# Patient Record
Sex: Male | Born: 1965 | Hispanic: Yes | Marital: Married | Smoking: Never smoker
Health system: Southern US, Community
[De-identification: ages and names within clinical notes are randomized; demographics above are authoritative.]

## PROBLEM LIST (undated history)

## (undated) DIAGNOSIS — E119 Type 2 diabetes mellitus without complications: Secondary | ICD-10-CM

## (undated) DIAGNOSIS — I10 Essential (primary) hypertension: Secondary | ICD-10-CM

---

## 2017-09-26 ENCOUNTER — Encounter (HOSPITAL_COMMUNITY): Payer: Self-pay

## 2017-09-26 ENCOUNTER — Emergency Department (HOSPITAL_COMMUNITY): Payer: BLUE CROSS/BLUE SHIELD

## 2017-09-26 ENCOUNTER — Other Ambulatory Visit: Payer: Self-pay

## 2017-09-26 DIAGNOSIS — R7989 Other specified abnormal findings of blood chemistry: Secondary | ICD-10-CM | POA: Diagnosis not present

## 2017-09-26 DIAGNOSIS — Z79899 Other long term (current) drug therapy: Secondary | ICD-10-CM | POA: Diagnosis not present

## 2017-09-26 DIAGNOSIS — I10 Essential (primary) hypertension: Secondary | ICD-10-CM | POA: Diagnosis not present

## 2017-09-26 DIAGNOSIS — E119 Type 2 diabetes mellitus without complications: Secondary | ICD-10-CM | POA: Insufficient documentation

## 2017-09-26 DIAGNOSIS — H539 Unspecified visual disturbance: Secondary | ICD-10-CM | POA: Insufficient documentation

## 2017-09-26 LAB — CBC
HEMATOCRIT: 43.5 % (ref 39.0–52.0)
Hemoglobin: 14.3 g/dL (ref 13.0–17.0)
MCH: 30.2 pg (ref 26.0–34.0)
MCHC: 32.9 g/dL (ref 30.0–36.0)
MCV: 91.8 fL (ref 78.0–100.0)
PLATELETS: 495 10*3/uL — AB (ref 150–400)
RBC: 4.74 MIL/uL (ref 4.22–5.81)
RDW: 13.1 % (ref 11.5–15.5)
WBC: 12 10*3/uL — ABNORMAL HIGH (ref 4.0–10.5)

## 2017-09-26 LAB — BASIC METABOLIC PANEL
ANION GAP: 9 (ref 5–15)
BUN: 21 mg/dL — AB (ref 6–20)
CHLORIDE: 102 mmol/L (ref 101–111)
CO2: 25 mmol/L (ref 22–32)
CREATININE: 1 mg/dL (ref 0.61–1.24)
Calcium: 9.4 mg/dL (ref 8.9–10.3)
GFR calc non Af Amer: >60 mL/min (ref >60)
GLUCOSE: 110 mg/dL — AB (ref 65–99)
POTASSIUM: 3.9 mmol/L (ref 3.5–5.1)
SODIUM: 136 mmol/L (ref 135–145)

## 2017-09-26 LAB — I-STAT TROPONIN, ED: Troponin i, poc: 0 ng/mL (ref 0.00–0.08)

## 2017-09-26 LAB — CBG MONITORING, ED: GLUCOSE-CAPILLARY: 93 mg/dL (ref 65–99)

## 2017-09-26 NOTE — ED Triage Notes (Signed)
Per Pt, Pt started to have chest pain, blurred vision, dizziness, and SOB one hour ago. Reports that he was finished with work when he was on his way to work. Alert and Oriented x4 upon arrival. No radiation of pain.

## 2017-09-27 ENCOUNTER — Emergency Department (HOSPITAL_COMMUNITY)
Admission: EM | Admit: 2017-09-27 | Discharge: 2017-09-27 | Disposition: A | Discharge status: Home / Self Care | Payer: BLUE CROSS/BLUE SHIELD | Attending: Emergency Medicine | Admitting: Emergency Medicine

## 2017-09-27 ENCOUNTER — Emergency Department (HOSPITAL_COMMUNITY): Payer: BLUE CROSS/BLUE SHIELD

## 2017-09-27 DIAGNOSIS — H539 Unspecified visual disturbance: Secondary | ICD-10-CM

## 2017-09-27 DIAGNOSIS — R7989 Other specified abnormal findings of blood chemistry: Secondary | ICD-10-CM

## 2017-09-27 HISTORY — DX: Type 2 diabetes mellitus without complications: E11.9

## 2017-09-27 HISTORY — DX: Essential (primary) hypertension: I10

## 2017-09-27 NOTE — ED Provider Notes (Signed)
[facility] EMERGENCY DEPARTMENT Provider Note   CSN: [id number] Arrival date & time: 09/26/17  1658     History   Chief Complaint Chief Complaint  Patient presents with  . Chest Pain    HPI Joshua Kelly is a 52 y.o. male with a history of diabetes, hypertension, who presents today for evaluation of silver squiggles in his vision.  He reports that he had an episode of that earlier this morning that then resolved.  He reports that he then had a second episode of seeing silver squiggles, and started to feel very anxious as his friend reportedly told him this ment was having a stroke.  He then reports that he became scared, started breathing quickly, feeling like his heart was going to beat out of his chest, and developing tingling and spasms in his fingers.  He reports that this all resolved within a few minutes of arriving at the hospital as he reportedly felt safe.  He reports that he still has the silver squiggles in his vision, however better than before. He denies any headache.  No recent trauma.   HPI  Past Medical History:  Diagnosis Date  . Diabetes mellitus without complication (HCC)   . Hypertension     There are no active problems to display for this patient.   History reviewed. No pertinent surgical history.     Home Medications    Prior to Admission medications   Medication Sig Start Date End Date Taking? Authorizing Provider  Empagliflozin-Linagliptin (GLYXAMBI) 10-5 MG TABS Take 1 tablet by mouth daily.   Yes [provider]  losartan (COZAAR) 25 MG tablet Take 25 mg by mouth daily.   Yes [provider]  simvastatin (ZOCOR) 20 MG tablet Take 20 mg by mouth daily.   Yes [provider]    Family History No family history on file.  Social History Social History   Tobacco Use  . Smoking status: Never Smoker  . Smokeless tobacco: Never Used  Substance Use Topics  . Alcohol use: No    Frequency:  Never  . Drug use: No     Allergies   Patient has no known allergies.   Review of Systems Review of Systems  Constitutional: Positive for diaphoresis (Fully resolved). Negative for chills and fever.  HENT: Negative for ear pain and sore throat.   Eyes: Positive for visual disturbance. Negative for photophobia, pain and discharge.  Respiratory: Positive for shortness of breath (Fully resolved). Negative for cough.   Cardiovascular: Positive for chest pain (Fully resolved). Negative for palpitations.  Gastrointestinal: Negative for abdominal pain, nausea and vomiting.  Genitourinary: Negative for dysuria and hematuria.  Musculoskeletal: Negative for arthralgias and back pain.  Skin: Negative for color change and rash.  Neurological: Negative for seizures and syncope.       Tingling in bilateral fingers which has fully resolved.  All other systems reviewed and are negative.    Physical Exam Updated Vital Signs BP 111/79   Pulse (!) 56   Temp 98.9 F (37.2 C) (Oral)   Resp 15   Ht 5\' 8"  (1.727 m)   Wt 90.7 kg (200 lb)   SpO2 93%   BMI 30.41 kg/m   Physical Exam  Constitutional: He is oriented to person, place, and time. He appears well-developed and well-nourished.  HENT:  Head: Normocephalic and atraumatic.  Eyes: Conjunctivae and EOM are normal. Pupils are equal, round, and reactive to light.  Neck: Neck supple.  Cardiovascular:  Normal rate and regular rhythm.  No murmur heard. Pulmonary/Chest: Effort normal and breath sounds normal. No respiratory distress. He has no decreased breath sounds. He has no wheezes. He has no rhonchi.  Abdominal: Soft. There is no tenderness.  Musculoskeletal: He exhibits no edema.  Neurological: He is alert and oriented to person, place, and time.  Mental Status:  Alert, oriented, thought content appropriate, able to give a coherent history. Speech fluent without evidence of aphasia. Able to follow 2 step commands without difficulty.    Cranial Nerves:  II:  Peripheral visual fields grossly normal, pupils equal, round, reactive to light III,IV, VI: ptosis not present, extra-ocular motions intact bilaterally  V,VII: smile symmetric, facial light touch sensation equal VIII: hearing grossly normal to voice  X: uvula elevates symmetrically  XI: bilateral shoulder shrug symmetric and strong XII: midline tongue extension without fassiculations Motor:  Normal tone. 5/5 in upper and lower extremities bilaterally including strong and equal grip strength and dorsiflexion/plantar flexion Cerebellar: normal finger-to-nose with bilateral upper extremities CV: distal pulses palpable throughout    Skin: Skin is warm and dry.  Psychiatric: He has a normal mood and affect.  Nursing note and vitals reviewed.    ED Treatments / Results  Labs (all labs ordered are listed, but only abnormal results are displayed) Labs Reviewed  BASIC METABOLIC PANEL - Abnormal; Notable for the following components:      Result Value   Glucose, Bld 110 (*)    BUN 21 (*)    All other components within normal limits  CBC - Abnormal; Notable for the following components:   WBC 12.0 (*)    Platelets 495 (*)    All other components within normal limits  I-STAT TROPONIN, ED  CBG MONITORING, ED    EKG  EKG Interpretation  Date/Time:  Thursday September 26 2017 17:05:29 EST Ventricular Rate:  84 PR Interval:  156 QRS Duration: 102 QT Interval:  372 QTC Calculation: 439 R Axis:   17 Text Interpretation:  Normal sinus rhythm Normal ECG No previous ECGs available Confirmed by Alvira Monday ([id number]) on 09/27/2017 11:08:08 PM       Radiology Dg Chest 2 View  Result Date: 09/26/2017 CLINICAL DATA:  Pt c/o an episode of chest pain and SOB at 5pm today. Hx of DM AND HTN. Pt is a nonsmoker. EXAM: CHEST  2 VIEW COMPARISON:  None. FINDINGS: Heart size and mediastinal contours are within normal limits. Lungs are clear. No pleural effusion or  pneumothorax seen. Prominent dextroscoliosis of the thoracolumbar spine. No acute or suspicious osseous finding. IMPRESSION: No active cardiopulmonary disease. No evidence of pneumonia or pulmonary edema. Prominent scoliosis of the thoracic spine. Electronically Signed   By: Bary Richard M.D.   On: 09/26/2017 21:12   Ct Head Wo Contrast  Result Date: 09/27/2017 CLINICAL DATA:  Diplopia EXAM: CT HEAD WITHOUT CONTRAST TECHNIQUE: Contiguous axial images were obtained from the base of the skull through the vertex without intravenous contrast. COMPARISON:  None. FINDINGS: Brain: No mass lesion, intraparenchymal hemorrhage or extra-axial collection. No evidence of acute cortical infarct. Brain parenchyma and CSF-containing spaces are normal for age. Vascular: No hyperdense vessel or unexpected calcification. Skull: Normal visualized skull base, calvarium and extracranial soft tissues. Sinuses/Orbits: No sinus fluid levels or advanced mucosal thickening. No mastoid effusion. Normal orbits. IMPRESSION: Normal head CT. Electronically Signed   By: Deatra Robinson M.D.   On: 09/27/2017 03:35    Procedures Procedures (including critical care time)  Medications Ordered  in ED Medications - No data to display   Initial Impression / Assessment and Plan / ED Course  I have reviewed the triage vital signs and the nursing notes.  Pertinent labs & imaging results that were available during my care of the patient were reviewed by me and considered in my medical decision making (see chart for details).     Joshua Kelly presents today for evaluation of seeing silver squiggly lines in his vision.  These visual disturbances initially went away, however when they came back he reported significant anxiety as his friend told him he was having a stroke and then had chest pain, shortness of breath, rapid heart rate, consistent with a panic attack.  Labs were obtained and reviewed, he was informed that his platelets  were mildly elevated and that he needs to follow-up with his PCP about this.  No obvious cause for his symptoms found.  CT head was performed without significant abnormality.  Chest x-ray and EKG were both performed and unremarkable, at the time of evaluation he did not have any chest pain.  I suspect that his chest pain was related to anxiety and possible panic attack.  The visual disturbance resolved while in the emergency room without any specific treatment.  I do not think that his chest pain is from a cardiac or respiratory cause based on the timing of events and the situation.  Troponin normal.  Patient was advised to follow-up with his PCP regarding his ED visit and symptoms.  He was advised that if his chest pain returns he needs to seek additional medical evaluation.   He stated his understanding of chest pain, and general return precautions.  He reports that he has an appointment with his PCP already next week and will keep this appointment.  Discharged home.  Hemodynamically stable.  Final Clinical Impressions(s) / ED Diagnoses   Final diagnoses:  Visual disturbances  Increased platelet count    ED Discharge Orders    None       Norman ClayHammond, Daksha Koone W, PA-C 09/28/17 [id number]    Geoffery Lyonselo, Douglas, MD 10/01/17 [id number]

## 2017-09-27 NOTE — Discharge Instructions (Signed)
I have given you the information for a local neruologist to follow up with.  Please follow up with them or another allergist regarding your ED visit and symptoms today.  If your symptoms worsen, you develop headaches, or you have any new or worsening symptoms then please seek additional medical evaluation.  Today your CT scan did not show any obvious abnormalities.  Today your platelets were increased.  Please follow-up with your primary care doctor regarding this.

## 2017-09-27 NOTE — ED Notes (Signed)
Pt reports an episode of mid sternal chest pressure and visual disturbance of "silver patches" at approx 1700 today. Pt denies any pain or visual issues at this time.

## 2019-02-20 IMAGING — CT CT HEAD W/O CM
4 series · 15 of 47 positions shown, 17 images · non-contrast
Comparison: None.

CLINICAL DATA: Diplopia

EXAM:
CT HEAD WITHOUT CONTRAST
TECHNIQUE: Contiguous axial images were obtained from the base of the skull
through the vertex without intravenous contrast.

[Series 3: head wo · axial · 0.44mm/px · z∈[+1251,+1371]mm · 7 of 34 slices shown, 9 images]
[im 5/34  brain]
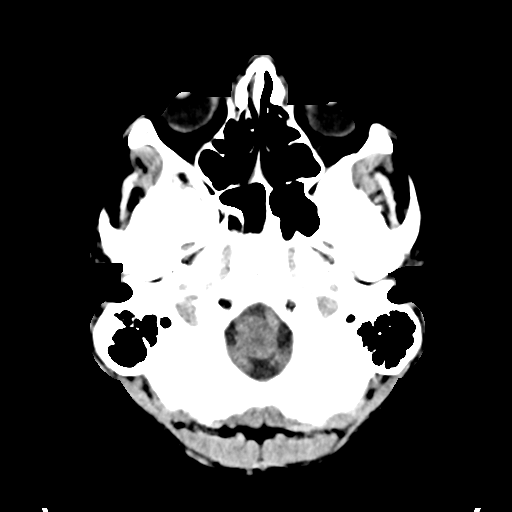
[im 5/34  bone]
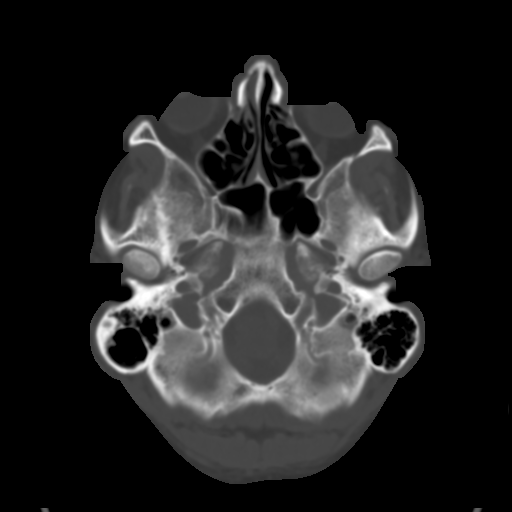
[im 9/34  brain]
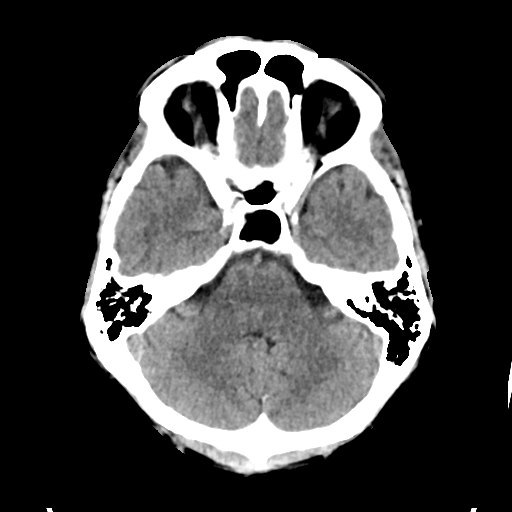
[im 13/34  brain]
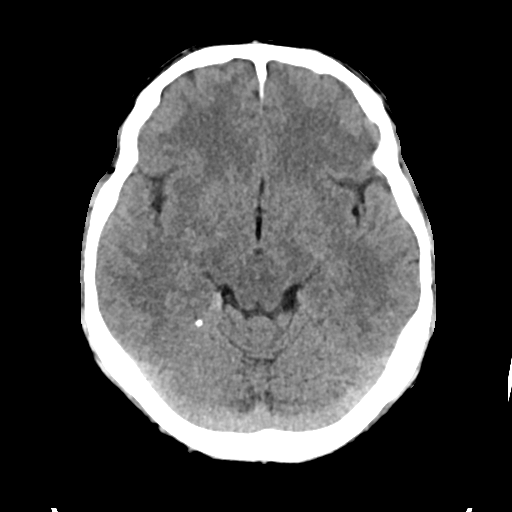
[im 17/34  brain]
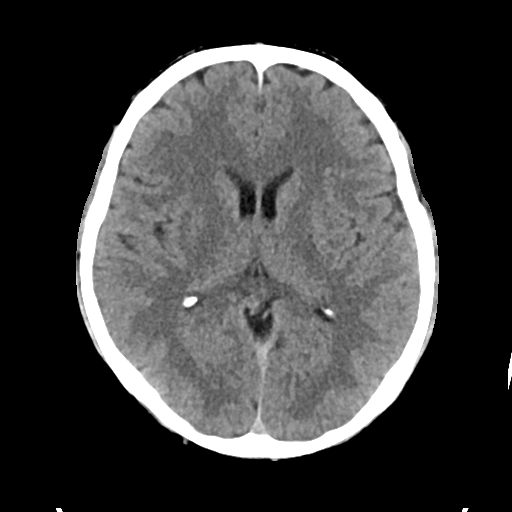
[im 21/34  brain]
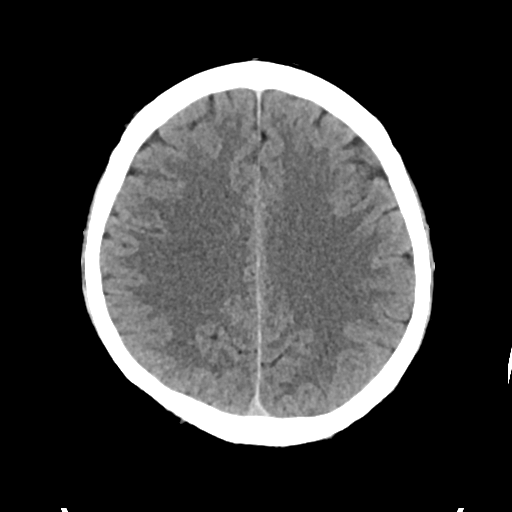
[im 21/34  bone]
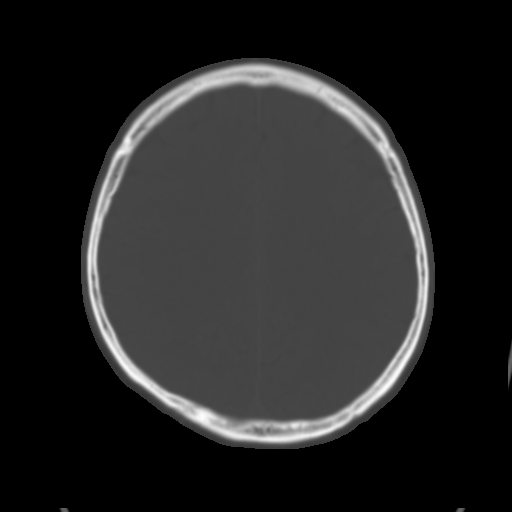
[im 25/34  brain]
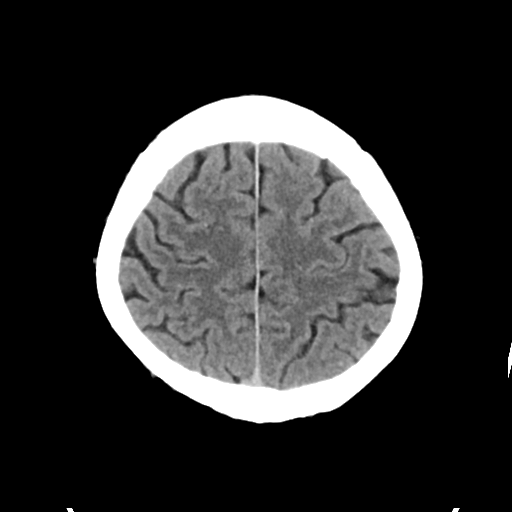
[im 29/34  brain]
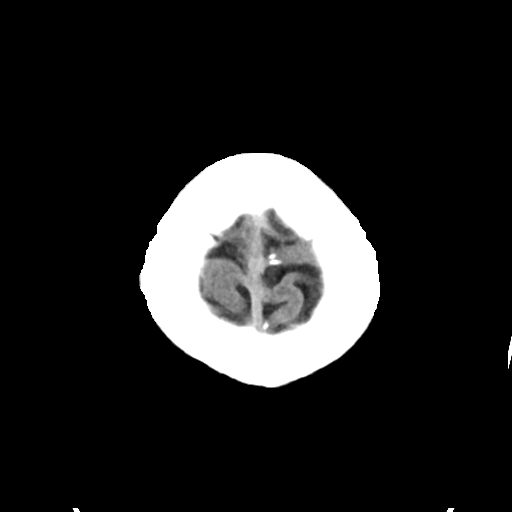

[Series 4: head bone · axial · 0.44mm/px · z∈[+1247,+1263]mm · 2 of 84 slices shown]
[im 9/84  bone]
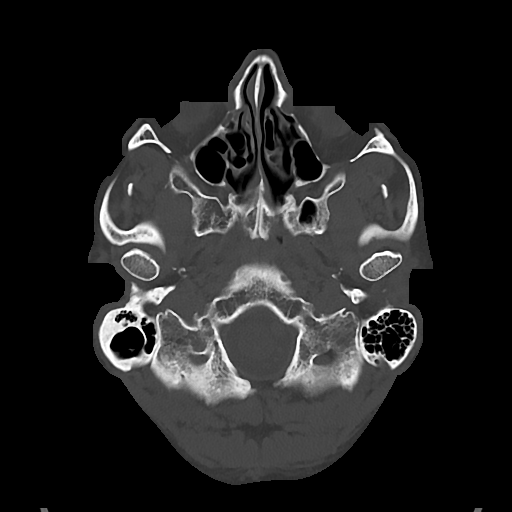
[im 17/84  bone]
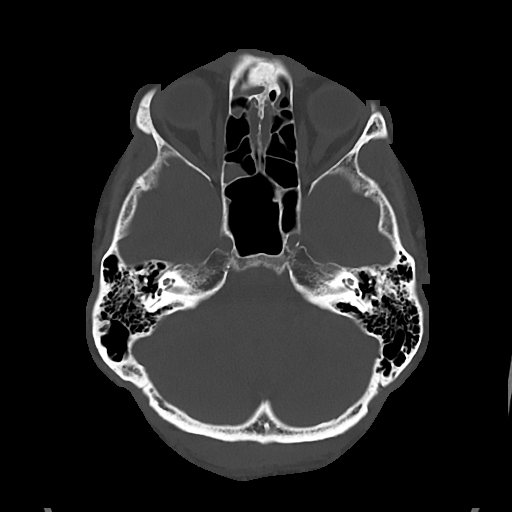

[Series 5: cor soft · coronal · 0.33mm/px · 3 of 76 slices shown]
[im 26/76  brain]
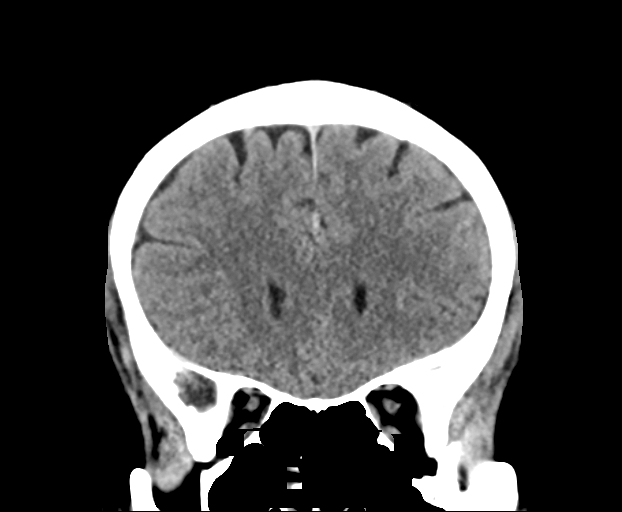
[im 34/76  brain]
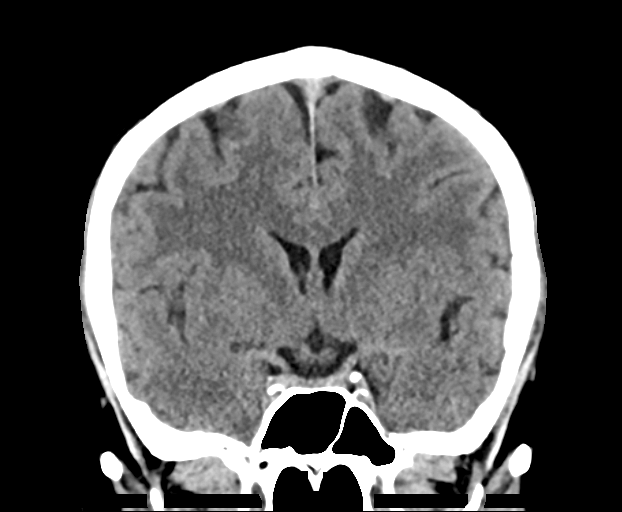
[im 42/76  brain]
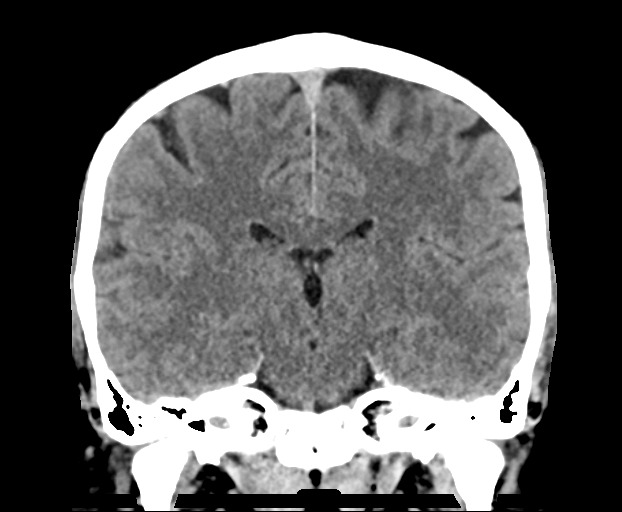

[Series 6: sag soft · sagittal · 0.33mm/px · 3 of 67 slices shown]
[im 23/67  brain]
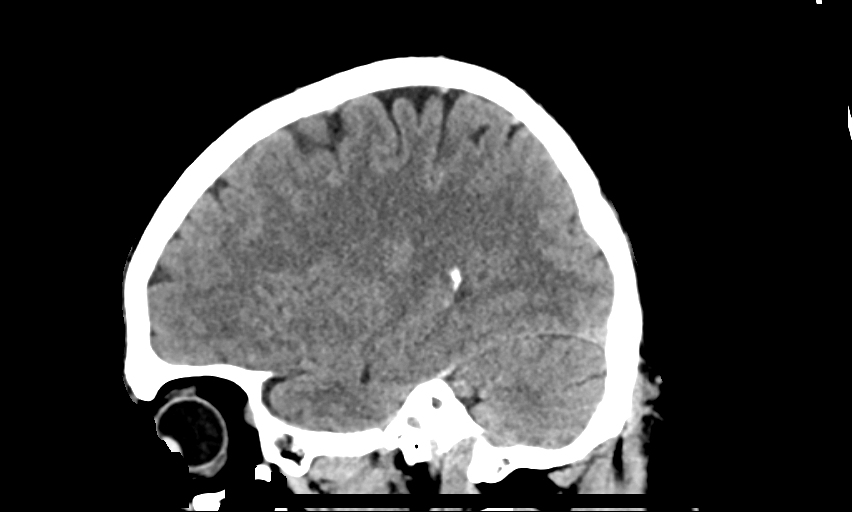
[im 34/67  brain]
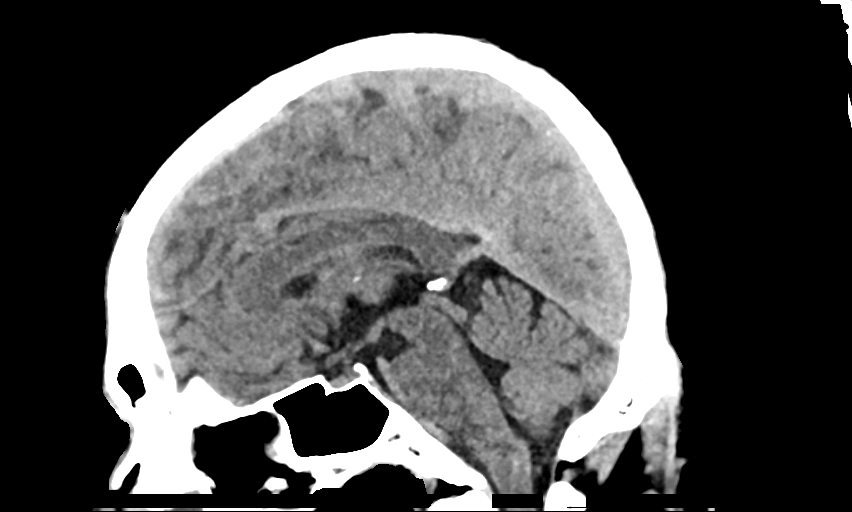
[im 45/67  brain]
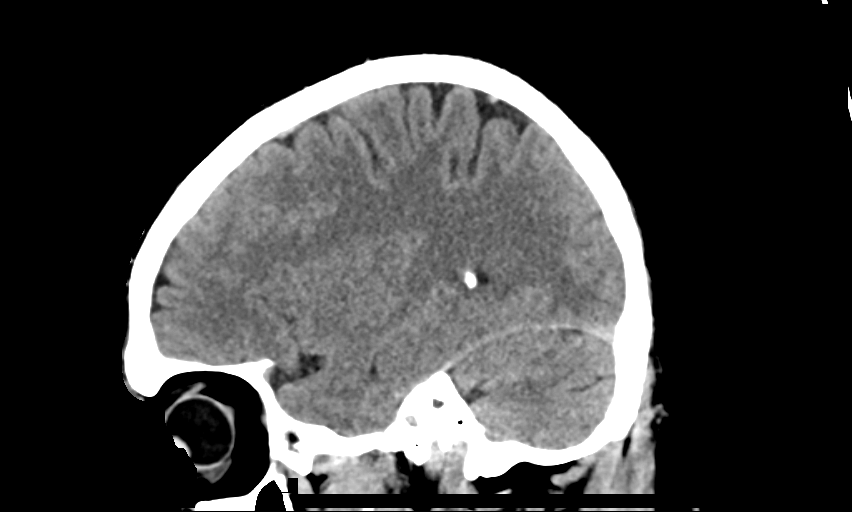

[15 of 47 positions shown; findings below may reference images not displayed]

FINDINGS: Brain: No mass lesion, intraparenchymal hemorrhage or extra-axial
collection. No evidence of acute cortical infarct. Brain parenchyma
and CSF-containing spaces are normal for age.

Vascular: No hyperdense vessel or unexpected calcification.

Skull: Normal visualized skull base, calvarium and extracranial soft
tissues.

Sinuses/Orbits: No sinus fluid levels or advanced mucosal
thickening. No mastoid effusion. Normal orbits.
IMPRESSION: Normal head CT.
